# Patient Record
Sex: Male | Born: 1955
Health system: Southern US, Community
[De-identification: ages and names within clinical notes are randomized; demographics above are authoritative.]

## PROBLEM LIST (undated history)

## (undated) DIAGNOSIS — I10 Essential (primary) hypertension: Secondary | ICD-10-CM

## (undated) DIAGNOSIS — G518 Other disorders of facial nerve: Secondary | ICD-10-CM

## (undated) DIAGNOSIS — M1990 Unspecified osteoarthritis, unspecified site: Secondary | ICD-10-CM

## (undated) DIAGNOSIS — M545 Low back pain, unspecified: Secondary | ICD-10-CM

## (undated) DIAGNOSIS — K219 Gastro-esophageal reflux disease without esophagitis: Secondary | ICD-10-CM

## (undated) DIAGNOSIS — Z8719 Personal history of other diseases of the digestive system: Secondary | ICD-10-CM

## (undated) DIAGNOSIS — Z87442 Personal history of urinary calculi: Secondary | ICD-10-CM

## (undated) DIAGNOSIS — F411 Generalized anxiety disorder: Secondary | ICD-10-CM

## (undated) DIAGNOSIS — Z86010 Personal history of colonic polyps: Secondary | ICD-10-CM

## (undated) DIAGNOSIS — E7800 Pure hypercholesterolemia, unspecified: Secondary | ICD-10-CM

## (undated) DIAGNOSIS — Z860101 Personal history of adenomatous and serrated colon polyps: Secondary | ICD-10-CM

## (undated) DIAGNOSIS — N400 Enlarged prostate without lower urinary tract symptoms: Secondary | ICD-10-CM

## (undated) HISTORY — PX: LITHOTRIPSY: SUR834

## (undated) HISTORY — DX: Pure hypercholesterolemia, unspecified: E78.00

## (undated) HISTORY — DX: Generalized anxiety disorder: F41.1

## (undated) HISTORY — PX: VASECTOMY: SHX75

## (undated) HISTORY — DX: Personal history of other diseases of the digestive system: Z87.19

## (undated) HISTORY — DX: Other disorders of facial nerve: G51.8

## (undated) HISTORY — PX: NOSE SURGERY: SHX723

## (undated) HISTORY — DX: Low back pain, unspecified: M54.50

## (undated) HISTORY — DX: Essential (primary) hypertension: I10

## (undated) HISTORY — DX: Benign prostatic hyperplasia without lower urinary tract symptoms: N40.0

## (undated) HISTORY — DX: Unspecified osteoarthritis, unspecified site: M19.90

## (undated) HISTORY — DX: Personal history of colonic polyps: Z86.010

## (undated) HISTORY — DX: Personal history of urinary calculi: Z87.442

## (undated) HISTORY — DX: Personal history of adenomatous and serrated colon polyps: Z86.0101

## (undated) HISTORY — DX: Gastro-esophageal reflux disease without esophagitis: K21.9

## (undated) HISTORY — PX: FACIAL COSMETIC SURGERY: SHX629

---
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
## 2000-06-26 ENCOUNTER — Encounter: Payer: Self-pay | Admitting: Family Medicine

## 2000-06-26 ENCOUNTER — Encounter: Admission: RE | Admit: 2000-06-26 | Discharge: 2000-06-26 | Payer: Self-pay | Admitting: Family Medicine

## 2001-03-10 ENCOUNTER — Encounter: Admission: RE | Admit: 2001-03-10 | Discharge: 2001-03-10 | Payer: Self-pay | Admitting: *Deleted

## 2001-03-10 ENCOUNTER — Encounter: Payer: Self-pay | Admitting: Family Medicine

## 2003-03-26 ENCOUNTER — Encounter: Payer: Self-pay | Admitting: Emergency Medicine

## 2003-03-26 ENCOUNTER — Emergency Department (HOSPITAL_COMMUNITY): Admission: EM | Admit: 2003-03-26 | Discharge: 2003-03-26 | Payer: Self-pay | Admitting: Emergency Medicine

## 2007-07-20 ENCOUNTER — Emergency Department (HOSPITAL_COMMUNITY): Admission: EM | Admit: 2007-07-20 | Discharge: 2007-07-20 | Payer: Self-pay | Admitting: Emergency Medicine

## 2007-07-26 ENCOUNTER — Ambulatory Visit (HOSPITAL_COMMUNITY): Admission: RE | Admit: 2007-07-26 | Discharge: 2007-07-26 | Payer: Self-pay | Admitting: Urology

## 2007-08-16 ENCOUNTER — Ambulatory Visit (HOSPITAL_COMMUNITY): Admission: RE | Admit: 2007-08-16 | Discharge: 2007-08-16 | Payer: Self-pay | Admitting: Urology

## 2009-02-12 ENCOUNTER — Ambulatory Visit (HOSPITAL_COMMUNITY): Admission: RE | Admit: 2009-02-12 | Discharge: 2009-02-12 | Payer: Self-pay | Admitting: *Deleted

## 2009-02-12 ENCOUNTER — Encounter (INDEPENDENT_AMBULATORY_CARE_PROVIDER_SITE_OTHER): Payer: Self-pay | Admitting: *Deleted

## 2010-04-15 ENCOUNTER — Ambulatory Visit (HOSPITAL_COMMUNITY): Admission: RE | Admit: 2010-04-15 | Discharge: 2010-04-15 | Payer: Self-pay | Admitting: Urology

## 2011-01-21 NOTE — Op Note (Signed)
NAMETAYLER, [name]:  [id number]   MEDICAL RECORD NO.:  [id number]          PATIENT TYPE:  AMB   LOCATION:  ENDO                         FACILITY:  [facility]   PHYSICIAN:  Georgiana Spinner, M.D.    DATE OF BIRTH:  1955/10/03   DATE OF PROCEDURE:  02/12/2009  DATE OF DISCHARGE:                               OPERATIVE REPORT   PROCEDURE:  Colonoscopy.   INDICATIONS:  Colon cancer screening.   ANESTHESIA:  Fentanyl 100 mcg, Versed 10 mg and Benadryl 25 mg..   INDICATIONS:  Colon cancer screening.   PROCEDURE:  With the patient mildly sedated in the left lateral  decubitus position, a rectal exam was performed which was unremarkable.  Subsequently the Pentax videoscopic pediatric colonoscope was inserted  in the rectum and passed under direct vision to the cecum, identified by  ileocecal valve and appendiceal orifice, both of which were  photographed. From this point the colonoscope was slowly withdrawn  taking circumferential views of colonic mucosa, stopping in the  ascending colon where we saw two polyps and removed them both, first  with snare cautery technique, second with snare cautery technique,  followed by hot biopsy forceps technique.  They were both pedunculated,  and with the second I had to cauterize some remaining tissue on the  stalk with the hot biopsy forceps.  All the tissue was retrieved for  pathology and from this point, the colonoscope was slowly withdrawn,  taking circumferential views of colonic mucosa, stopping in the rectum  which appeared normal on direct and showed hemorrhoids on retroflexed  view, and pulled through the anal canal.  The endoscope was withdrawn.  The patient's vital signs, pulse oximeter remained stable.  The patient  tolerated procedure well without apparent complications.   FINDINGS:  Internal hemorrhoids, small polyps of ascending colon  removed.  Await biopsy report.  The patient will call me for results and  follow-up with me as an outpatient.           ______________________________  Georgiana Spinner, M.D.     GMO/MEDQ  D:  02/12/2009  T:  02/12/2009  Job:  [id number]

## 2011-01-21 NOTE — Op Note (Signed)
NAMESIMPSON, PAULOS            ACCOUNT NO.:  [id number]   MEDICAL RECORD NO.:  [id number]          PATIENT TYPE:  AMB   LOCATION:  DAY                          [facility]ACILITY:  [facility]   PHYSICIAN:  Valetta Fuller, M.D.  DATE OF BIRTH:  1955-10-18   DATE OF PROCEDURE:  08/16/2007  DATE OF DISCHARGE:                               OPERATIVE REPORT   PREOPERATIVE DIAGNOSES:  1. His right mid to proximal ureteral calculus.  2. Status post failed lithotripsy.   POSTOPERATIVE DIAGNOSES:  1. His right mid to proximal ureteral calculus.  2. Status post failed lithotripsy.   PROCEDURE PERFORMED:  Cystoscopy, right ureteroscopy, holmium laser  lithotripsy, basketing of the stone fragments, and double-J stent  placement.   SURGEON:  Valetta Fuller, M.D.   ANESTHESIA:  General.   INDICATIONS:  Mr. Ryan Webster is a 55 year old male.  He had presented  with right-sided abdominal discomfort and had been diagnosed with some  bilateral renal calculi and what appeared to be about a 5 to 6-mm stone  in his right mid-ureter.  The stone was well visualized on KUB, and he  elected to have lithotripsy.  That was performed approximately 3 weeks  ago.  At the time of surgery, we did not see a lot of fragmentation.  The patient has passed no pieces.  He came in for followup recently, and  I KUB'd the stone, and it appeared to be relatively unchanged in  appearance and location.  We discussed the alternative management  possibilities with him and have decided to proceed with ureteroscopy.  He appears to understand the complications as well the advantages of  this type of approach.  Full informed consent has been obtained.   TECHNIQUE AND FINDINGS:  The patient was brought to the operating room.  He had received perioperative ciprofloxacin.  He had successful  induction of general anesthesia and was placed in lithotomy position.  He was prepped and draped in the usual manner.   Fluoroscopy showed the  calcification again in the area of the right mid-  ureter.  There was no need really to confirm that with retrograde  pyelography.  Cystoscopy showed moderate trilobar hyperplasia with a  fairly high-riding median bar.  The bladder otherwise showed no  pathology.  A sensor guidewire was placed with fluoroscopic guidance  beyond the stone to the renal pelvis without difficulty.  We initially  engaged the distal ureter with the short rigid ureteroscope but were  unable to get up to the stone.  We subsequently used the long 6.5-French  rigid ureteroscope and were easily able to get to the stone.  There was  about a 5 to 6-mm stone near the junction of the proximal and mid-  ureter.  There appeared to be some moderately inflamed mucosa in that  area.  Holmium laser lithotriptor was used to break up the stone very  nicely into numerous pieces.  The largest 2 to 3-mm fragments were  basket extracted.  Because of the inflammation, we felt that double-J  stent placement should be performed.  There was no need to dilate the  distal ureter, but again, there was an area of inflammation within the  mid-ureter where the stone had been present for probably about a month.  Over the guidewire, we placed a 6-French 24-cm double-J stent  and left the dangle string which was secured to the patient's penis.  The stent was placed with fluoroscopic as well as visual guidance.  No  obvious complications occurred.  The patient appeared to tolerate things  well.  He was brought to the recovery room in stable condition.           ______________________________  Valetta Fuller, M.D.  Electronically Signed     DSG/MEDQ  D:  08/16/2007  T:  08/16/2007  Job:  [id number]

## 2011-06-16 LAB — BASIC METABOLIC PANEL
Calcium: 9.1
GFR calc Af Amer: >60
Potassium: 3.9
Sodium: 139

## 2011-06-17 LAB — URINALYSIS, ROUTINE W REFLEX MICROSCOPIC
Bilirubin Urine: NEGATIVE
Glucose, UA: NEGATIVE
Ketones, ur: 15 — AB
Leukocytes, UA: NEGATIVE
Nitrite: NEGATIVE
Protein, ur: NEGATIVE
Specific Gravity, Urine: 1.027
Urobilinogen, UA: 0.2
pH: 6

## 2011-06-17 LAB — CBC
HCT: 46.4
Hemoglobin: 16.2
MCHC: 34.9
MCV: 95.9
Platelets: 272
RBC: 4.84
RDW: 12.6
WBC: 8

## 2011-06-17 LAB — URINE MICROSCOPIC-ADD ON

## 2011-06-17 LAB — DIFFERENTIAL
Basophils Absolute: 0
Basophils Relative: 1
Eosinophils Absolute: 0 — ABNORMAL LOW
Eosinophils Relative: 1
Lymphocytes Relative: 17
Lymphs Abs: 1.4
Monocytes Absolute: 0.6
Monocytes Relative: 7
Neutro Abs: 5.9
Neutrophils Relative %: 74

## 2011-06-17 LAB — URINE CULTURE
Colony Count: NO GROWTH
Culture: NO GROWTH

## 2011-06-17 LAB — BASIC METABOLIC PANEL WITH GFR
CO2: 24
Calcium: 9.3
Chloride: 107
GFR calc non Af Amer: 57 — ABNORMAL LOW
Potassium: 3.7

## 2011-06-17 LAB — BASIC METABOLIC PANEL
BUN: 21
Creatinine, Ser: 1.32
GFR calc Af Amer: >60
Glucose, Bld: 147 — ABNORMAL HIGH
Sodium: 140

## 2012-07-24 IMAGING — CR DG ABDOMEN 1V
2 series · 2 of 2 positions shown · non-contrast
Comparison: 07/26/2007

CLINICAL DATA: 54-year-old male with the left UPJ stone

ABDOMEN - 1 VIEW

[t abdomen supine (1 of 2)]
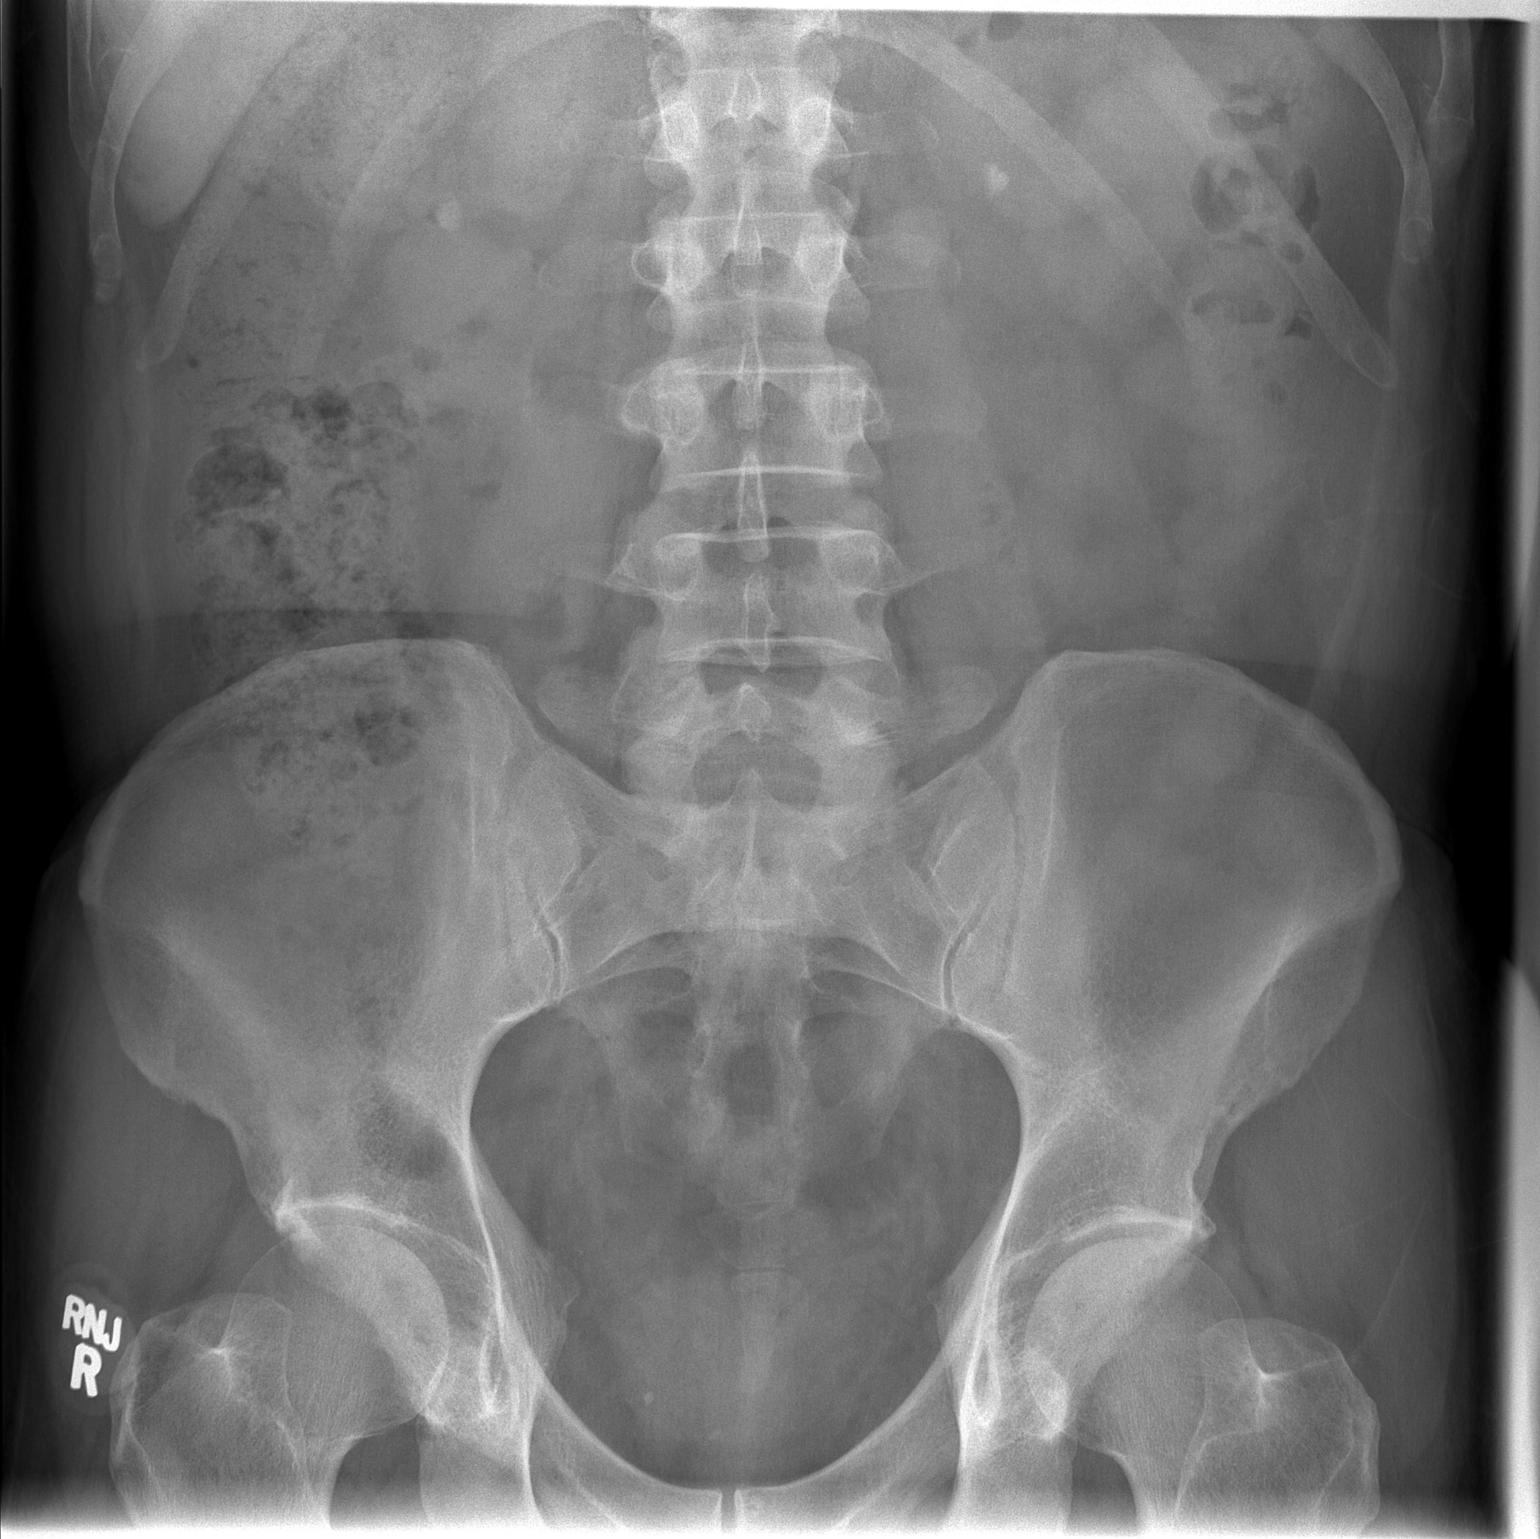

[t abdomen supine (2 of 2)]
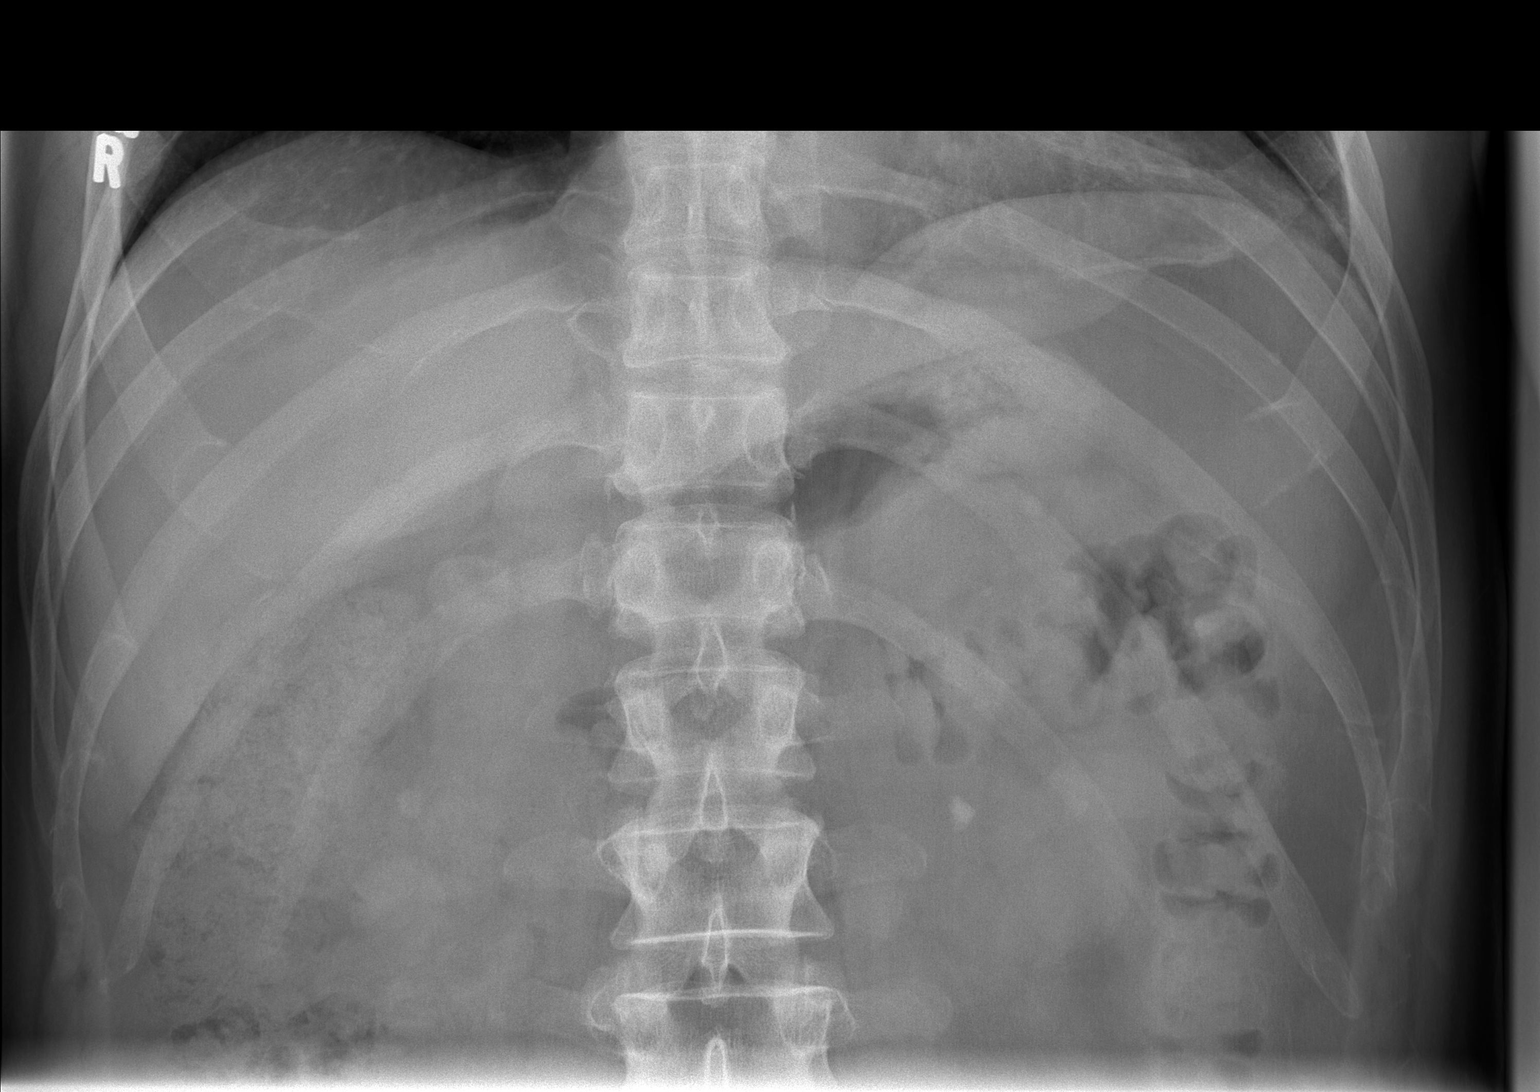

[2 of 2 positions shown; findings below may reference images not displayed]

FINDINGS: Bilateral renal calculi are seen;  6 mm in the right
kidney and 7 mm in the left kidney/left UPJ.  Phleboliths are again
seen in the pelvis.  There are no other unexpected calcifications.
Stool and gas seen throughout the colon.  The bones are
unremarkable.  The lung bases are clear.
IMPRESSION: Bilateral renal calculi detailed above.

## 2013-10-28 ENCOUNTER — Ambulatory Visit (INDEPENDENT_AMBULATORY_CARE_PROVIDER_SITE_OTHER): Payer: BC Managed Care – PPO | Admitting: Neurology

## 2013-10-28 ENCOUNTER — Encounter: Payer: Self-pay | Admitting: Neurology

## 2013-10-28 ENCOUNTER — Encounter (INDEPENDENT_AMBULATORY_CARE_PROVIDER_SITE_OTHER): Payer: Self-pay

## 2013-10-28 ENCOUNTER — Telehealth: Payer: Self-pay | Admitting: *Deleted

## 2013-10-28 VITALS — BP 120/80 | HR 62 | Ht 75.0 in | Wt 208.0 lb

## 2013-10-28 DIAGNOSIS — R253 Fasciculation: Secondary | ICD-10-CM

## 2013-10-28 DIAGNOSIS — G518 Other disorders of facial nerve: Secondary | ICD-10-CM

## 2013-10-28 DIAGNOSIS — G245 Blepharospasm: Secondary | ICD-10-CM

## 2013-10-28 MED ORDER — INCOBOTULINUMTOXINA 50 UNITS IM SOLR
50.0000 [IU] | Freq: Once | INTRAMUSCULAR | Status: AC
  Administered 2013-10-28: 50 [IU] via INTRAMUSCULAR
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
## 2014-01-25 ENCOUNTER — Ambulatory Visit: Payer: BC Managed Care – PPO | Admitting: Neurology

## 2014-03-01 ENCOUNTER — Ambulatory Visit (INDEPENDENT_AMBULATORY_CARE_PROVIDER_SITE_OTHER): Payer: BC Managed Care – PPO | Admitting: Neurology

## 2014-03-01 ENCOUNTER — Encounter: Payer: Self-pay | Admitting: Neurology

## 2014-03-01 DIAGNOSIS — G518 Other disorders of facial nerve: Secondary | ICD-10-CM

## 2014-03-01 DIAGNOSIS — R253 Fasciculation: Secondary | ICD-10-CM

## 2014-03-01 DIAGNOSIS — G245 Blepharospasm: Secondary | ICD-10-CM

## 2014-03-01 MED ORDER — INCOBOTULINUMTOXINA 50 UNITS IM SOLR
50.0000 [IU] | Freq: Once | INTRAMUSCULAR | Status: AC
  Administered 2014-03-01: 50 [IU] via INTRAMUSCULAR
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
## 2014-06-07 ENCOUNTER — Ambulatory Visit (INDEPENDENT_AMBULATORY_CARE_PROVIDER_SITE_OTHER): Payer: BC Managed Care – PPO | Admitting: Neurology

## 2014-06-07 ENCOUNTER — Encounter (INDEPENDENT_AMBULATORY_CARE_PROVIDER_SITE_OTHER): Payer: Self-pay

## 2014-06-07 DIAGNOSIS — G518 Other disorders of facial nerve: Secondary | ICD-10-CM

## 2014-06-07 DIAGNOSIS — G245 Blepharospasm: Secondary | ICD-10-CM

## 2014-06-07 DIAGNOSIS — R253 Fasciculation: Secondary | ICD-10-CM

## 2014-06-07 MED ORDER — INCOBOTULINUMTOXINA 50 UNITS IM SOLR
50.0000 [IU] | Freq: Once | INTRAMUSCULAR | Status: AC
  Administered 2014-06-07: 50 [IU] via INTRAMUSCULAR
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
## 2014-09-06 ENCOUNTER — Ambulatory Visit (INDEPENDENT_AMBULATORY_CARE_PROVIDER_SITE_OTHER): Payer: BC Managed Care – PPO | Admitting: Neurology

## 2014-09-06 ENCOUNTER — Encounter: Payer: Self-pay | Admitting: Neurology

## 2014-09-06 DIAGNOSIS — R253 Fasciculation: Secondary | ICD-10-CM

## 2014-09-06 DIAGNOSIS — G518 Other disorders of facial nerve: Secondary | ICD-10-CM

## 2014-09-06 DIAGNOSIS — R258 Other abnormal involuntary movements: Secondary | ICD-10-CM

## 2014-09-06 MED ORDER — INCOBOTULINUMTOXINA 50 UNITS IM SOLR
50.0000 [IU] | Freq: Once | INTRAMUSCULAR | Status: AC
  Administered 2014-09-06: 50 [IU] via INTRAMUSCULAR
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
## 2014-12-13 ENCOUNTER — Ambulatory Visit: Payer: Self-pay | Admitting: Neurology

## 2015-01-01 ENCOUNTER — Other Ambulatory Visit: Payer: Self-pay | Admitting: Gastroenterology

## 2015-01-30 ENCOUNTER — Encounter: Payer: Self-pay | Admitting: *Deleted

## 2015-01-30 DIAGNOSIS — G245 Blepharospasm: Secondary | ICD-10-CM

## 2015-01-30 DIAGNOSIS — G5139 Clonic hemifacial spasm, unspecified: Secondary | ICD-10-CM

## 2015-01-31 ENCOUNTER — Ambulatory Visit (INDEPENDENT_AMBULATORY_CARE_PROVIDER_SITE_OTHER): Payer: BLUE CROSS/BLUE SHIELD | Admitting: Neurology

## 2015-01-31 ENCOUNTER — Encounter: Payer: Self-pay | Admitting: Neurology

## 2015-01-31 VITALS — BP 142/91 | HR 63 | Ht 75.0 in | Wt 203.0 lb

## 2015-01-31 DIAGNOSIS — G245 Blepharospasm: Secondary | ICD-10-CM | POA: Diagnosis not present

## 2015-01-31 MED ORDER — INCOBOTULINUMTOXINA 50 UNITS IM SOLR
50.0000 [IU] | Freq: Once | INTRAMUSCULAR | Status: AC
  Administered 2015-01-31: 50 [IU] via INTRAMUSCULAR
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
## 2015-01-31 NOTE — Progress Notes (Signed)
**  Xeomin 50, Lot [id number], Exp 10/2016**mck,rn

## 2016-09-15 DIAGNOSIS — R9431 Abnormal electrocardiogram [ECG] [EKG]: Secondary | ICD-10-CM | POA: Diagnosis not present

## 2016-09-15 DIAGNOSIS — I10 Essential (primary) hypertension: Secondary | ICD-10-CM | POA: Diagnosis not present

## 2017-01-08 DIAGNOSIS — E7800 Pure hypercholesterolemia, unspecified: Secondary | ICD-10-CM | POA: Diagnosis not present

## 2017-01-14 DIAGNOSIS — E7800 Pure hypercholesterolemia, unspecified: Secondary | ICD-10-CM | POA: Diagnosis not present

## 2017-01-14 DIAGNOSIS — I10 Essential (primary) hypertension: Secondary | ICD-10-CM | POA: Diagnosis not present

## 2017-04-13 DIAGNOSIS — L732 Hidradenitis suppurativa: Secondary | ICD-10-CM | POA: Diagnosis not present

## 2017-04-13 DIAGNOSIS — L0292 Furuncle, unspecified: Secondary | ICD-10-CM | POA: Diagnosis not present

## 2017-04-30 DIAGNOSIS — M545 Low back pain: Secondary | ICD-10-CM | POA: Diagnosis not present

## 2017-04-30 DIAGNOSIS — L739 Follicular disorder, unspecified: Secondary | ICD-10-CM | POA: Diagnosis not present

## 2017-05-06 DIAGNOSIS — M5489 Other dorsalgia: Secondary | ICD-10-CM | POA: Diagnosis not present

## 2017-06-22 NOTE — Telephone Encounter (Signed)
Close Encounter 

## 2017-07-16 DIAGNOSIS — Z125 Encounter for screening for malignant neoplasm of prostate: Secondary | ICD-10-CM | POA: Diagnosis not present

## 2017-07-16 DIAGNOSIS — Z0000 Encounter for general adult medical examination without abnormal findings: Secondary | ICD-10-CM | POA: Diagnosis not present

## 2017-07-22 DIAGNOSIS — Z0000 Encounter for general adult medical examination without abnormal findings: Secondary | ICD-10-CM | POA: Diagnosis not present

## 2017-07-22 DIAGNOSIS — Z23 Encounter for immunization: Secondary | ICD-10-CM | POA: Diagnosis not present

## 2017-07-22 DIAGNOSIS — N200 Calculus of kidney: Secondary | ICD-10-CM | POA: Diagnosis not present

## 2017-11-19 ENCOUNTER — Ambulatory Visit: Payer: BLUE CROSS/BLUE SHIELD | Admitting: Neurology

## 2017-11-30 DIAGNOSIS — M9904 Segmental and somatic dysfunction of sacral region: Secondary | ICD-10-CM | POA: Diagnosis not present

## 2017-11-30 DIAGNOSIS — M9903 Segmental and somatic dysfunction of lumbar region: Secondary | ICD-10-CM | POA: Diagnosis not present

## 2017-11-30 DIAGNOSIS — M9905 Segmental and somatic dysfunction of pelvic region: Secondary | ICD-10-CM | POA: Diagnosis not present

## 2017-12-07 DIAGNOSIS — M9905 Segmental and somatic dysfunction of pelvic region: Secondary | ICD-10-CM | POA: Diagnosis not present

## 2017-12-07 DIAGNOSIS — M9903 Segmental and somatic dysfunction of lumbar region: Secondary | ICD-10-CM | POA: Diagnosis not present

## 2017-12-07 DIAGNOSIS — M9904 Segmental and somatic dysfunction of sacral region: Secondary | ICD-10-CM | POA: Diagnosis not present

## 2017-12-31 DIAGNOSIS — M9903 Segmental and somatic dysfunction of lumbar region: Secondary | ICD-10-CM | POA: Diagnosis not present

## 2017-12-31 DIAGNOSIS — M9904 Segmental and somatic dysfunction of sacral region: Secondary | ICD-10-CM | POA: Diagnosis not present

## 2017-12-31 DIAGNOSIS — M9905 Segmental and somatic dysfunction of pelvic region: Secondary | ICD-10-CM | POA: Diagnosis not present

## 2018-04-07 DIAGNOSIS — R6889 Other general symptoms and signs: Secondary | ICD-10-CM | POA: Diagnosis not present

## 2018-04-30 DIAGNOSIS — K5901 Slow transit constipation: Secondary | ICD-10-CM | POA: Diagnosis not present

## 2018-04-30 DIAGNOSIS — K625 Hemorrhage of anus and rectum: Secondary | ICD-10-CM | POA: Diagnosis not present

## 2018-05-03 DIAGNOSIS — N200 Calculus of kidney: Secondary | ICD-10-CM | POA: Diagnosis not present

## 2018-06-16 DIAGNOSIS — K921 Melena: Secondary | ICD-10-CM | POA: Diagnosis not present

## 2018-07-29 DIAGNOSIS — Z0000 Encounter for general adult medical examination without abnormal findings: Secondary | ICD-10-CM | POA: Diagnosis not present

## 2018-07-29 DIAGNOSIS — Z125 Encounter for screening for malignant neoplasm of prostate: Secondary | ICD-10-CM | POA: Diagnosis not present

## 2018-08-02 DIAGNOSIS — Z0000 Encounter for general adult medical examination without abnormal findings: Secondary | ICD-10-CM | POA: Diagnosis not present

## 2018-08-02 DIAGNOSIS — N200 Calculus of kidney: Secondary | ICD-10-CM | POA: Diagnosis not present

## 2018-08-02 DIAGNOSIS — I10 Essential (primary) hypertension: Secondary | ICD-10-CM | POA: Diagnosis not present

## 2018-08-02 DIAGNOSIS — Z1212 Encounter for screening for malignant neoplasm of rectum: Secondary | ICD-10-CM | POA: Diagnosis not present

## 2018-08-23 DIAGNOSIS — D123 Benign neoplasm of transverse colon: Secondary | ICD-10-CM | POA: Diagnosis not present

## 2018-08-23 DIAGNOSIS — Z86010 Personal history of colonic polyps: Secondary | ICD-10-CM | POA: Diagnosis not present

## 2018-08-23 DIAGNOSIS — K621 Rectal polyp: Secondary | ICD-10-CM | POA: Diagnosis not present

## 2018-08-23 DIAGNOSIS — D124 Benign neoplasm of descending colon: Secondary | ICD-10-CM | POA: Diagnosis not present

## 2019-01-17 DIAGNOSIS — W57XXXA Bitten or stung by nonvenomous insect and other nonvenomous arthropods, initial encounter: Secondary | ICD-10-CM | POA: Diagnosis not present

## 2019-01-17 DIAGNOSIS — S20469A Insect bite (nonvenomous) of unspecified back wall of thorax, initial encounter: Secondary | ICD-10-CM | POA: Diagnosis not present

## 2019-12-14 ENCOUNTER — Ambulatory Visit: Payer: 59 | Admitting: Dermatology

## 2019-12-14 ENCOUNTER — Other Ambulatory Visit: Payer: Self-pay

## 2019-12-14 ENCOUNTER — Encounter: Payer: Self-pay | Admitting: Dermatology

## 2019-12-14 DIAGNOSIS — D485 Neoplasm of uncertain behavior of skin: Secondary | ICD-10-CM | POA: Diagnosis not present

## 2019-12-14 DIAGNOSIS — I781 Nevus, non-neoplastic: Secondary | ICD-10-CM | POA: Diagnosis not present

## 2019-12-14 DIAGNOSIS — D492 Neoplasm of unspecified behavior of bone, soft tissue, and skin: Secondary | ICD-10-CM

## 2019-12-14 NOTE — Patient Instructions (Addendum)
Biopsy, Surgery (Curettage) & Surgery (Excision) Aftercare Instructions  1. Okay to remove bandage in 24 hours  2. Wash area with soap and water  3. Apply Vaseline to area twice daily until healed (Not Neosporin)  4. Okay to cover with a Band-Aid to decrease the chance of infection or prevent irritation from clothing; also it's okay to uncover lesion at home.  5. Suture instructions: return to our office in 7-10 or 10-14 days for a nurse visit for suture removal. Variable healing with sutures, if pain or itching occurs call our office. It's okay to shower or bathe 24 hours after sutures are given.  6. The following risks may occur after a biopsy, curettage or excision: bleeding, scarring, discoloration, recurrence, infection (redness, yellow drainage, pain or swelling).  7. For questions, concerns and results call our office at [place] before 4pm & Friday before 3pm. Biopsy results will be available in 1 week.  In addition to obtaining a biopsy on Mr. Robel right arm we discussed treatment options for the reticular and spider veins on his legs.  I recommended he consult Dr. Cresenciano Lick and specifically to ask whether he does sclerotherapy with Asclera or polidocanol.

## 2019-12-15 ENCOUNTER — Encounter: Payer: Self-pay | Admitting: Dermatology

## 2019-12-15 NOTE — Progress Notes (Addendum)
   New Patient   Subjective  Ryan Webster is a 64 y.o. male who presents for the following: New Patient (Initial Visit) (Patient here today for bump on right arm x few years comes back during the warm months.  Patient denies bleeding unless he picks at it, no pain. Patient also has a question about spider veins.).  Growth Location: Right forearm Duration: Months Quality: Larger Associated Signs/Symptoms: Modifying Factors:  Severity:  Timing: Context:    The following portions of the chart were reviewed this encounter and updated as appropriate: Tobacco  Allergies  Meds  Problems  Med Hx  Surg Hx  Fam Hx      Objective  Well appearing patient in no apparent distress; mood and affect are within normal limits.  All skin waist up examined.Plus legs. No atypcial moles  Assessment & Plan  Neoplasm of skin Right Dorsal Forearm  Skin / nail biopsy Type of biopsy: tangential   Informed consent: discussed and consent obtained   Anesthesia: the lesion was anesthetized in a standard fashion   Anesthetic:  1% lidocaine w/ epinephrine 1-100,000 local infiltration Instrument used: flexible razor blade   Hemostasis achieved with: ferric subsulfate   Outcome: patient tolerated procedure well   Post-procedure details: sterile dressing applied and wound care instructions given   Dressing type: petrolatum   Additional details:  Patient identified lesion of concern.  Lesion identified by physician.  Specimen 1 - Surgical pathology Differential Diagnosis: R/O BCC vs SCC Check Margins: No Cautery  Spider veins (3) Left Thigh - Anterior; Right Thigh - Anterior (2)  This would do better with polidocanol than saline sclerotherapy. Will refer to Dr. Cresenciano Lick.

## 2020-02-29 ENCOUNTER — Telehealth: Payer: Self-pay

## 2020-02-29 NOTE — Telephone Encounter (Signed)
Spoke with patient in regards to being seen again. He's going to reach out to his PCP and get a referral.

## 2020-02-29 NOTE — Telephone Encounter (Signed)
Pt called office and left a VM asking for a call to discuss scheduling another appt with Dr. Krista Blue.

## 2020-04-10 ENCOUNTER — Ambulatory Visit: Payer: 59 | Admitting: Neurology

## 2020-04-10 ENCOUNTER — Telehealth: Payer: Self-pay | Admitting: Neurology

## 2020-04-10 ENCOUNTER — Encounter: Payer: Self-pay | Admitting: *Deleted

## 2020-04-10 ENCOUNTER — Encounter: Payer: Self-pay | Admitting: Neurology

## 2020-04-10 VITALS — BP 142/92 | HR 72 | Ht 75.25 in | Wt 202.0 lb

## 2020-04-10 DIAGNOSIS — R253 Fasciculation: Secondary | ICD-10-CM | POA: Diagnosis not present

## 2020-04-10 DIAGNOSIS — G518 Other disorders of facial nerve: Secondary | ICD-10-CM | POA: Insufficient documentation

## 2020-04-10 DIAGNOSIS — G244 Idiopathic orofacial dystonia: Secondary | ICD-10-CM | POA: Diagnosis not present
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
## 2020-04-10 NOTE — Telephone Encounter (Signed)
EMG guided xeomin 50 units for G24.4, G51.8

## 2020-04-18 NOTE — Telephone Encounter (Signed)
In process 

## 2020-04-19 NOTE — Telephone Encounter (Signed)
Noted  

## 2020-04-19 NOTE — Telephone Encounter (Signed)
I called [employer] and spoke with Summer. She states codes [phone], [phone], [phone] are valid and billable and do not require PA. She states [facility] is the pharmacy patient will need to use. Reference #[id number].   Could you please send patient's Xeomin prescription to [facility]? He will need 50 units.

## 2020-04-19 NOTE — Telephone Encounter (Signed)
Patient returned my call and we scheduled first injection for next available of 9/22.

## 2020-04-19 NOTE — Telephone Encounter (Signed)
I called the patient and LVM to schedule first Xeomin injection.

## 2020-05-21 NOTE — Telephone Encounter (Signed)
I called [facility] and spoke with Genia Del to check the status of Xeomin order. Genia Del states that they did not receive a prescription for patient that was supposed to be sent in early August. He forwarded me to [facility], a pharmacist, to give a verbal prescription. Order will need to go through medical benefit review and then we will be called to schedule delivery.

## 2020-05-23 NOTE — Telephone Encounter (Signed)
I called [facility] to check the status and spoke with Genia Del again. He states that it is still under medical benefit review and I should call back in 24 hours.

## 2020-05-29 NOTE — Telephone Encounter (Signed)
I called [facility] again today to check the status. I spoke with Opal Sidles, who states that medication is ready to be shipped. However, patient has not given consent for shipment. I called the patient and was able to initiate a conference call for the 3 of [place]. Opal Sidles states that patient has a $200 co-pay for the Xeomin. Patient states that he is not able to afford that at this time. He wishes to cancel his 9/22 appointment and will call back at a later date. FYI

## 2020-05-29 NOTE — Telephone Encounter (Signed)
Try buy and bill, 50 units of xeomin on this patient,

## 2020-05-29 NOTE — Telephone Encounter (Signed)
I called the patient and explained to him the difference between SP and B/B. I advised that if he has a co-pay when he visits our office, he will still have to pay it, but will not have to pay anything else up front. Also advised that he may still receive a bill in the mail after his injection. He agreed and was placed back on the schedule for tomorrow at 2:00.

## 2020-05-30 ENCOUNTER — Encounter: Payer: Self-pay | Admitting: Neurology

## 2020-05-30 ENCOUNTER — Ambulatory Visit: Payer: 59 | Admitting: Neurology

## 2020-05-30 ENCOUNTER — Other Ambulatory Visit: Payer: Self-pay

## 2020-05-30 VITALS — BP 154/86 | HR 68 | Ht 75.25 in | Wt 204.0 lb

## 2020-05-30 DIAGNOSIS — G518 Other disorders of facial nerve: Secondary | ICD-10-CM | POA: Diagnosis not present

## 2020-05-30 DIAGNOSIS — R253 Fasciculation: Secondary | ICD-10-CM

## 2020-05-30 MED ORDER — INCOBOTULINUMTOXINA 50 UNITS IM SOLR
50.0000 [IU] | INTRAMUSCULAR | Status: AC
  Administered 2020-05-30: 50 [IU] via INTRAMUSCULAR
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
## 2020-05-30 NOTE — Progress Notes (Signed)
**  Xeomin 50 units x 1 vial, NDC 0259-1605-01, Lot [id number], Exp 12/2021, office supply.//mck,rn** 

## 2020-07-11 NOTE — Telephone Encounter (Signed)
FYI Angie 

## 2020-09-19 ENCOUNTER — Ambulatory Visit: Payer: 59 | Admitting: Neurology

## 2020-09-20 ENCOUNTER — Telehealth: Payer: Self-pay | Admitting: Neurology

## 2020-09-20 NOTE — Telephone Encounter (Signed)
I have been communicating with patient regarding the cost of Xeomin. He states he can't afford to pay what he has been charged in the past for injections. I spoke with patient and advised him of the [facility] and he is willing to try it. I filled out an enrollment form for patient and faxed it to the program. I sent patient a packet in the mail with instructions on how to participate in the program and submit claims after injections.

## 2020-10-04 ENCOUNTER — Other Ambulatory Visit: Payer: Self-pay | Admitting: Internal Medicine

## 2020-10-04 DIAGNOSIS — I10 Essential (primary) hypertension: Secondary | ICD-10-CM

## 2020-10-19 ENCOUNTER — Ambulatory Visit
Admission: RE | Admit: 2020-10-19 | Discharge: 2020-10-19 | Disposition: A | Discharge status: Home / Self Care | Payer: No Typology Code available for payment source | Source: Ambulatory Visit | Attending: Internal Medicine | Admitting: Internal Medicine

## 2020-10-19 DIAGNOSIS — I10 Essential (primary) hypertension: Secondary | ICD-10-CM

## 2020-12-19 ENCOUNTER — Ambulatory Visit: Payer: 59 | Admitting: Neurology

## 2021-08-28 NOTE — Telephone Encounter (Signed)
Spoke with patient today. Patient is scheduled for Xeomin injection 10/07/21 with Dr. Krista Blue.

## 2021-09-25 ENCOUNTER — Telehealth: Payer: Self-pay | Admitting: Neurology

## 2021-09-25 NOTE — Telephone Encounter (Signed)
Patient is scheduled for Xeomin injection 10/07/21. Obtained PA for Xeomin via [facility] portal. [place] #[id number] (09/24/21-09/24/22). Fasciculation- R25.3 (50 units).

## 2021-10-04 DIAGNOSIS — R7301 Impaired fasting glucose: Secondary | ICD-10-CM | POA: Diagnosis not present

## 2021-10-04 DIAGNOSIS — I10 Essential (primary) hypertension: Secondary | ICD-10-CM | POA: Diagnosis not present

## 2021-10-04 DIAGNOSIS — E7800 Pure hypercholesterolemia, unspecified: Secondary | ICD-10-CM | POA: Diagnosis not present

## 2021-10-04 DIAGNOSIS — Z0000 Encounter for general adult medical examination without abnormal findings: Secondary | ICD-10-CM | POA: Diagnosis not present

## 2021-10-07 ENCOUNTER — Ambulatory Visit: Payer: Medicare Other | Admitting: Neurology

## 2021-10-07 ENCOUNTER — Encounter: Payer: Self-pay | Admitting: Neurology

## 2021-10-07 VITALS — BP 170/90 | HR 99 | Ht 75.0 in | Wt 204.0 lb

## 2021-10-07 DIAGNOSIS — G244 Idiopathic orofacial dystonia: Secondary | ICD-10-CM
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
## 2021-10-07 NOTE — Progress Notes (Signed)
Xeomin 50 units  COB-7949-9718-20 [id number] Exp-04/2022  B/B

## 2021-10-08 DIAGNOSIS — L409 Psoriasis, unspecified: Secondary | ICD-10-CM | POA: Diagnosis not present

## 2021-10-08 DIAGNOSIS — Z23 Encounter for immunization: Secondary | ICD-10-CM | POA: Diagnosis not present

## 2021-10-08 DIAGNOSIS — N200 Calculus of kidney: Secondary | ICD-10-CM | POA: Diagnosis not present

## 2021-10-08 DIAGNOSIS — I700 Atherosclerosis of aorta: Secondary | ICD-10-CM | POA: Diagnosis not present

## 2021-10-08 DIAGNOSIS — R7303 Prediabetes: Secondary | ICD-10-CM | POA: Diagnosis not present

## 2021-10-08 DIAGNOSIS — I2510 Atherosclerotic heart disease of native coronary artery without angina pectoris: Secondary | ICD-10-CM | POA: Diagnosis not present

## 2021-10-08 DIAGNOSIS — Z0000 Encounter for general adult medical examination without abnormal findings: Secondary | ICD-10-CM | POA: Diagnosis not present

## 2021-10-08 DIAGNOSIS — I10 Essential (primary) hypertension: Secondary | ICD-10-CM | POA: Diagnosis not present

## 2021-10-08 DIAGNOSIS — Z1212 Encounter for screening for malignant neoplasm of rectum: Secondary | ICD-10-CM | POA: Diagnosis not present

## 2021-10-08 DIAGNOSIS — E7800 Pure hypercholesterolemia, unspecified: Secondary | ICD-10-CM | POA: Diagnosis not present

## 2021-10-11 DIAGNOSIS — G244 Idiopathic orofacial dystonia: Secondary | ICD-10-CM | POA: Diagnosis not present

## 2021-10-11 MED ORDER — INCOBOTULINUMTOXINA 50 UNITS IM SOLR
50.0000 [IU] | INTRAMUSCULAR | Status: AC
  Administered 2021-10-11: 50 [IU] via INTRAMUSCULAR

## 2021-11-25 DIAGNOSIS — E7800 Pure hypercholesterolemia, unspecified: Secondary | ICD-10-CM | POA: Diagnosis not present

## 2021-11-25 DIAGNOSIS — I2510 Atherosclerotic heart disease of native coronary artery without angina pectoris: Secondary | ICD-10-CM | POA: Diagnosis not present

## 2021-12-09 ENCOUNTER — Ambulatory Visit: Payer: Medicare Other | Admitting: Neurology

## 2021-12-30 ENCOUNTER — Encounter: Payer: Self-pay | Admitting: Neurology

## 2022-01-06 ENCOUNTER — Ambulatory Visit: Payer: Medicare Other | Admitting: Neurology

## 2022-02-24 ENCOUNTER — Ambulatory Visit: Payer: Medicare Other | Admitting: Neurology

## 2022-02-24 ENCOUNTER — Encounter: Payer: Self-pay | Admitting: Neurology

## 2022-02-24 VITALS — BP 166/94 | HR 94 | Ht 75.0 in | Wt 204.0 lb

## 2022-02-24 DIAGNOSIS — G244 Idiopathic orofacial dystonia: Secondary | ICD-10-CM

## 2022-02-24 NOTE — Progress Notes (Unsigned)
Xeomin '50mg'$  x 1 vial Ndc-0259-1605-01 [id number] Exp-09-2022 B/B
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
## 2022-02-25 DIAGNOSIS — G244 Idiopathic orofacial dystonia: Secondary | ICD-10-CM | POA: Diagnosis not present

## 2022-02-25 MED ORDER — INCOBOTULINUMTOXINA 50 UNITS IM SOLR
50.0000 [IU] | INTRAMUSCULAR | Status: AC
  Administered 2022-02-25: 50 [IU] via INTRAMUSCULAR

## 2022-05-27 ENCOUNTER — Ambulatory Visit: Payer: Medicare Other | Admitting: Neurology

## 2022-06-18 ENCOUNTER — Ambulatory Visit: Payer: Medicare Other | Admitting: Neurology

## 2022-11-03 DIAGNOSIS — R7303 Prediabetes: Secondary | ICD-10-CM | POA: Diagnosis not present

## 2022-11-03 DIAGNOSIS — E7800 Pure hypercholesterolemia, unspecified: Secondary | ICD-10-CM | POA: Diagnosis not present

## 2022-11-10 DIAGNOSIS — R7303 Prediabetes: Secondary | ICD-10-CM | POA: Diagnosis not present

## 2022-11-10 DIAGNOSIS — Z86010 Personal history of colonic polyps: Secondary | ICD-10-CM | POA: Diagnosis not present

## 2022-11-10 DIAGNOSIS — Z0000 Encounter for general adult medical examination without abnormal findings: Secondary | ICD-10-CM | POA: Diagnosis not present

## 2022-11-10 DIAGNOSIS — I8390 Asymptomatic varicose veins of unspecified lower extremity: Secondary | ICD-10-CM | POA: Diagnosis not present

## 2022-11-10 DIAGNOSIS — I10 Essential (primary) hypertension: Secondary | ICD-10-CM | POA: Diagnosis not present

## 2022-11-10 DIAGNOSIS — N200 Calculus of kidney: Secondary | ICD-10-CM | POA: Diagnosis not present

## 2022-11-10 DIAGNOSIS — L409 Psoriasis, unspecified: Secondary | ICD-10-CM | POA: Diagnosis not present

## 2022-11-10 DIAGNOSIS — I2510 Atherosclerotic heart disease of native coronary artery without angina pectoris: Secondary | ICD-10-CM | POA: Diagnosis not present

## 2022-11-10 DIAGNOSIS — F959 Tic disorder, unspecified: Secondary | ICD-10-CM | POA: Diagnosis not present

## 2022-11-10 DIAGNOSIS — I700 Atherosclerosis of aorta: Secondary | ICD-10-CM | POA: Diagnosis not present

## 2023-05-26 DIAGNOSIS — M7130 Other bursal cyst, unspecified site: Secondary | ICD-10-CM | POA: Diagnosis not present

## 2023-05-26 DIAGNOSIS — I788 Other diseases of capillaries: Secondary | ICD-10-CM | POA: Diagnosis not present

## 2023-05-26 DIAGNOSIS — L570 Actinic keratosis: Secondary | ICD-10-CM | POA: Diagnosis not present

## 2023-06-10 DIAGNOSIS — M79661 Pain in right lower leg: Secondary | ICD-10-CM | POA: Diagnosis not present

## 2023-06-10 DIAGNOSIS — M79604 Pain in right leg: Secondary | ICD-10-CM | POA: Diagnosis not present

## 2023-06-10 DIAGNOSIS — R600 Localized edema: Secondary | ICD-10-CM | POA: Diagnosis not present

## 2023-06-10 DIAGNOSIS — I83811 Varicose veins of right lower extremities with pain: Secondary | ICD-10-CM | POA: Diagnosis not present

## 2023-06-10 DIAGNOSIS — I83891 Varicose veins of right lower extremities with other complications: Secondary | ICD-10-CM | POA: Diagnosis not present

## 2023-07-13 DIAGNOSIS — I83891 Varicose veins of right lower extremities with other complications: Secondary | ICD-10-CM | POA: Diagnosis not present

## 2023-07-20 DIAGNOSIS — I83811 Varicose veins of right lower extremities with pain: Secondary | ICD-10-CM | POA: Diagnosis not present

## 2023-07-20 DIAGNOSIS — I83891 Varicose veins of right lower extremities with other complications: Secondary | ICD-10-CM | POA: Diagnosis not present

## 2023-07-20 DIAGNOSIS — M7989 Other specified soft tissue disorders: Secondary | ICD-10-CM | POA: Diagnosis not present

## 2023-08-03 DIAGNOSIS — I83891 Varicose veins of right lower extremities with other complications: Secondary | ICD-10-CM | POA: Diagnosis not present

## 2023-08-18 DIAGNOSIS — M67442 Ganglion, left hand: Secondary | ICD-10-CM | POA: Diagnosis not present

## 2023-09-23 ENCOUNTER — Ambulatory Visit (HOSPITAL_BASED_OUTPATIENT_CLINIC_OR_DEPARTMENT_OTHER): Admit: 2023-09-23 | Payer: 59 | Admitting: Orthopedic Surgery

## 2023-09-23 ENCOUNTER — Encounter (HOSPITAL_BASED_OUTPATIENT_CLINIC_OR_DEPARTMENT_OTHER): Payer: Self-pay

## 2023-09-23 SURGERY — CYST REMOVAL
Anesthesia: Monitor Anesthesia Care | Laterality: Left

## 2023-10-26 DIAGNOSIS — G2581 Restless legs syndrome: Secondary | ICD-10-CM | POA: Diagnosis not present

## 2023-10-26 DIAGNOSIS — I872 Venous insufficiency (chronic) (peripheral): Secondary | ICD-10-CM | POA: Diagnosis not present

## 2023-10-26 DIAGNOSIS — I87393 Chronic venous hypertension (idiopathic) with other complications of bilateral lower extremity: Secondary | ICD-10-CM | POA: Diagnosis not present

## 2023-10-26 DIAGNOSIS — R600 Localized edema: Secondary | ICD-10-CM | POA: Diagnosis not present

## 2023-11-11 ENCOUNTER — Ambulatory Visit (HOSPITAL_BASED_OUTPATIENT_CLINIC_OR_DEPARTMENT_OTHER): Admission: RE | Admit: 2023-11-11 | Payer: Medicare Other | Source: Home / Self Care | Admitting: Orthopedic Surgery

## 2023-11-11 ENCOUNTER — Encounter (HOSPITAL_BASED_OUTPATIENT_CLINIC_OR_DEPARTMENT_OTHER): Admission: RE | Payer: Self-pay | Source: Home / Self Care

## 2023-11-11 SURGERY — CYST REMOVAL
Anesthesia: Monitor Anesthesia Care | Laterality: Left

## 2023-12-07 DIAGNOSIS — J4521 Mild intermittent asthma with (acute) exacerbation: Secondary | ICD-10-CM | POA: Diagnosis not present

## 2023-12-07 DIAGNOSIS — I10 Essential (primary) hypertension: Secondary | ICD-10-CM | POA: Diagnosis not present

## 2023-12-21 DIAGNOSIS — I10 Essential (primary) hypertension: Secondary | ICD-10-CM | POA: Diagnosis not present

## 2023-12-21 DIAGNOSIS — J4521 Mild intermittent asthma with (acute) exacerbation: Secondary | ICD-10-CM | POA: Diagnosis not present

## 2024-01-18 DIAGNOSIS — I10 Essential (primary) hypertension: Secondary | ICD-10-CM | POA: Diagnosis not present

## 2024-01-18 DIAGNOSIS — E7800 Pure hypercholesterolemia, unspecified: Secondary | ICD-10-CM | POA: Diagnosis not present

## 2024-01-18 DIAGNOSIS — R7303 Prediabetes: Secondary | ICD-10-CM | POA: Diagnosis not present

## 2024-01-25 DIAGNOSIS — R7303 Prediabetes: Secondary | ICD-10-CM | POA: Diagnosis not present

## 2024-01-25 DIAGNOSIS — F959 Tic disorder, unspecified: Secondary | ICD-10-CM | POA: Diagnosis not present

## 2024-01-25 DIAGNOSIS — I700 Atherosclerosis of aorta: Secondary | ICD-10-CM | POA: Diagnosis not present

## 2024-01-25 DIAGNOSIS — Z0000 Encounter for general adult medical examination without abnormal findings: Secondary | ICD-10-CM | POA: Diagnosis not present

## 2024-01-25 DIAGNOSIS — I2510 Atherosclerotic heart disease of native coronary artery without angina pectoris: Secondary | ICD-10-CM | POA: Diagnosis not present

## 2024-01-25 DIAGNOSIS — J4521 Mild intermittent asthma with (acute) exacerbation: Secondary | ICD-10-CM | POA: Diagnosis not present

## 2024-01-25 DIAGNOSIS — I10 Essential (primary) hypertension: Secondary | ICD-10-CM | POA: Diagnosis not present

## 2024-08-22 ENCOUNTER — Other Ambulatory Visit: Payer: Self-pay | Admitting: Gastroenterology

## 2024-09-12 ENCOUNTER — Encounter (HOSPITAL_COMMUNITY): Payer: Self-pay | Admitting: Gastroenterology

## 2024-09-12 NOTE — Progress Notes (Signed)
 Attempted to obtain medical history for pre op call via telephone, unable to reach at this time. HIPAA compliant voicemail message left requesting return call to pre surgical testing department.

## 2024-09-27 ENCOUNTER — Encounter (HOSPITAL_COMMUNITY): Payer: Self-pay | Admitting: Gastroenterology

## 2024-09-27 ENCOUNTER — Ambulatory Visit (HOSPITAL_COMMUNITY): Admitting: Anesthesiology

## 2024-09-27 ENCOUNTER — Encounter (HOSPITAL_COMMUNITY): Admitting: Anesthesiology

## 2024-09-27 ENCOUNTER — Ambulatory Visit (HOSPITAL_COMMUNITY)
Admission: RE | Admit: 2024-09-27 | Discharge: 2024-09-27 | Disposition: A | Discharge status: Home / Self Care | Attending: Gastroenterology | Admitting: Gastroenterology

## 2024-09-27 ENCOUNTER — Encounter (HOSPITAL_COMMUNITY)
Admission: RE | Disposition: A | Discharge status: Home / Self Care | Payer: Self-pay | Source: Home / Self Care | Attending: Gastroenterology

## 2024-09-27 ENCOUNTER — Other Ambulatory Visit: Payer: Self-pay

## 2024-09-27 DIAGNOSIS — I10 Essential (primary) hypertension: Secondary | ICD-10-CM

## 2024-09-27 DIAGNOSIS — K219 Gastro-esophageal reflux disease without esophagitis: Secondary | ICD-10-CM | POA: Insufficient documentation

## 2024-09-27 DIAGNOSIS — K5730 Diverticulosis of large intestine without perforation or abscess without bleeding: Secondary | ICD-10-CM

## 2024-09-27 DIAGNOSIS — K449 Diaphragmatic hernia without obstruction or gangrene: Secondary | ICD-10-CM | POA: Insufficient documentation

## 2024-09-27 DIAGNOSIS — Z860101 Personal history of adenomatous and serrated colon polyps: Secondary | ICD-10-CM | POA: Insufficient documentation

## 2024-09-27 DIAGNOSIS — F419 Anxiety disorder, unspecified: Secondary | ICD-10-CM | POA: Diagnosis not present

## 2024-09-27 DIAGNOSIS — Z1211 Encounter for screening for malignant neoplasm of colon: Secondary | ICD-10-CM | POA: Diagnosis not present

## 2024-09-27 DIAGNOSIS — K644 Residual hemorrhoidal skin tags: Secondary | ICD-10-CM | POA: Insufficient documentation

## 2024-09-27 DIAGNOSIS — K648 Other hemorrhoids: Secondary | ICD-10-CM | POA: Diagnosis not present

## 2024-09-27 HISTORY — PX: ESOPHAGOGASTRODUODENOSCOPY: SHX5428

## 2024-09-27 HISTORY — PX: COLONOSCOPY: SHX5424

## 2024-09-27 MED ORDER — SODIUM CHLORIDE 0.9 % IV SOLN: INTRAVENOUS | Status: DC

## 2024-09-27 MED ORDER — GLYCOPYRROLATE PF 0.2 MG/ML IJ SOSY
PREFILLED_SYRINGE | INTRAMUSCULAR | Status: DC | PRN
  Administered 2024-09-27: .1 mg via INTRAVENOUS

## 2024-09-27 MED ORDER — PROPOFOL 1000 MG/100ML IV EMUL
INTRAVENOUS | Status: AC
  Filled 2024-09-27: qty 100

## 2024-09-27 MED ORDER — SODIUM CHLORIDE 0.9 % IV SOLN
INTRAVENOUS | Status: AC | PRN
  Administered 2024-09-27: 500 mL via INTRAMUSCULAR

## 2024-09-27 MED ORDER — LIDOCAINE 2% (20 MG/ML) 5 ML SYRINGE
INTRAMUSCULAR | Status: DC | PRN
  Administered 2024-09-27: 50 mg via INTRAVENOUS

## 2024-09-27 MED ORDER — PROPOFOL 500 MG/50ML IV EMUL
INTRAVENOUS | Status: DC | PRN
  Administered 2024-09-27 (×2): 70 mg via INTRAVENOUS
  Administered 2024-09-27: 100 mg via INTRAVENOUS
  Administered 2024-09-27: 125 ug/kg/min via INTRAVENOUS

## 2024-09-27 NOTE — Anesthesia Postprocedure Evaluation (Signed)
"   Anesthesia Post Note  Patient: Ryan Webster  Procedure(s) Performed: COLONOSCOPY EGD (ESOPHAGOGASTRODUODENOSCOPY)     Patient location during evaluation: PACU Anesthesia Type: MAC Level of consciousness: awake and alert Pain management: pain level controlled Vital Signs Assessment: post-procedure vital signs reviewed and stable Respiratory status: spontaneous breathing, nonlabored ventilation, respiratory function stable and patient connected to nasal cannula oxygen Cardiovascular status: stable and blood pressure returned to baseline Postop Assessment: no apparent nausea or vomiting Anesthetic complications: no   There were no known notable events for this encounter.  Last Vitals:  Vitals:   09/27/24 0830 09/27/24 0840  BP: 133/81 (!) 145/86  Pulse: 89 65  Resp: 17 20  Temp:    SpO2: 95% 93%    Last Pain:  Vitals:   09/27/24 0840  TempSrc:   PainSc: 0-No pain                 Lynwood MARLA Cornea      "

## 2024-09-27 NOTE — Transfer of Care (Signed)
 Immediate Anesthesia Transfer of Care Note  Patient: Ryan Webster  Procedure(s) Performed: COLONOSCOPY EGD (ESOPHAGOGASTRODUODENOSCOPY)  Patient Location: Endoscopy Unit  Anesthesia Type:MAC  Level of Consciousness: awake, alert , oriented, and patient cooperative  Airway & Oxygen Therapy: Patient Spontanous Breathing and Patient connected to face mask oxygen  Post-op Assessment: Report given to RN and Post -op Vital signs reviewed and stable  Post vital signs: Reviewed and stable  Last Vitals:  Vitals Value Taken Time  BP 133/81 09/27/24 08:30  Temp 36.6 C 09/27/24 08:26  Pulse 90 09/27/24 08:32  Resp 24 09/27/24 08:32  SpO2 97 % 09/27/24 08:32  Vitals shown include unfiled device data.  Last Pain:  Vitals:   09/27/24 0830  TempSrc:   PainSc: 0-No pain         Complications: There were no known notable events for this encounter.

## 2024-09-27 NOTE — Discharge Instructions (Signed)
 YOU HAD AN ENDOSCOPIC PROCEDURE TODAY: Refer to the procedure report and other information in the discharge instructions given to you for any specific questions about what was found during the examination. If this information does not answer your questions, please call the [facility] office at [phone] to clarify.   YOU SHOULD EXPECT: Some feelings of bloating in the abdomen. Passage of more gas than usual. Walking can help get rid of the air that was put into your GI tract during the procedure and reduce the bloating. If you had a lower endoscopy (such as a colonoscopy or flexible sigmoidoscopy) you may notice spotting of blood in your stool or on the toilet paper. Some abdominal soreness may be present for a day or two, also.  DIET: Your first meal following the procedure should be a light meal and then it is ok to progress to your normal diet. A half-sandwich or bowl of soup is an example of a good first meal. Heavy or fried foods are harder to digest and may make you feel nauseous or bloated. Drink plenty of fluids but you should avoid alcoholic beverages for 24 hours.  ACTIVITY: Your care partner should take you home directly after the procedure. You should plan to take it easy, moving slowly for the rest of the day. You can resume normal activity the day after the procedure however YOU SHOULD NOT DRIVE, use power tools, machinery or perform tasks that involve climbing or major physical exertion for 24 hours (because of the sedation medicines used during the test).   SYMPTOMS TO REPORT IMMEDIATELY: A gastroenterologist can be reached at any hour. Please call [phone]  for any of the following symptoms:  Following lower endoscopy (colonoscopy, flexible sigmoidoscopy) Excessive amounts of blood in the stool  Significant tenderness, worsening of abdominal pains  Swelling of the abdomen that is new, acute  Fever of 100 or higher  Following upper endoscopy (EGD, EUS, ERCP, esophageal  dilation) Vomiting of blood or coffee ground material  New, significant abdominal pain  New, significant chest pain or pain under the shoulder blades  Painful or persistently difficult swallowing  New shortness of breath  Black, tarry-looking or red, bloody stools  FOLLOW UP:  If any biopsies were taken you will be contacted by phone or by letter within the next 1-3 weeks. Call [phone]  if you have not heard about the biopsies in 3 weeks.  Please also call with any specific questions about appointments or follow up tests.

## 2024-09-27 NOTE — Op Note (Signed)
 [facility] Patient Name: Cheveyo Virginia Procedure Date: 09/27/2024 MRN: [id number] Attending MD: Oliva Boots , MD, [id number] Date of Birth: Dec 09, 1955 CSN: [id number] Age: 69 Admit Type: Outpatient Procedure:                Colonoscopy Indications:              Last colonoscopy: December 2019, Follow-up for                            history of adenomatous polyps in the colon Providers:                Oliva Boots, MD, Willy Hummer, RN, Curtistine Bishop, Technician Referring MD:              Medicines:                Monitored Anesthesia Care Complications:            No immediate complications. Estimated Blood Loss:     Estimated blood loss: none. Procedure:                Pre-Anesthesia Assessment:                           - Prior to the procedure, a History and Physical                            was performed, and patient medications and                            allergies were reviewed. The patient's tolerance of                            previous anesthesia was also reviewed. The risks                            and benefits of the procedure and the sedation                            options and risks were discussed with the patient.                            All questions were answered, and informed consent                            was obtained. Prior Anticoagulants: The patient has                            taken no anticoagulant or antiplatelet agents. ASA                            Grade Assessment: II - A patient with mild systemic  disease. After reviewing the risks and benefits,                            the patient was deemed in satisfactory condition to                            undergo the procedure.                           After obtaining informed consent, the colonoscope                            was passed under direct vision. Throughout the                            procedure, the  patient's blood pressure, pulse, and                            oxygen saturations were monitored continuously. The                            [id number]-[id number] ([id number]) Olympus colonoscope was                            introduced through the anus and advanced to the the                            terminal ileum. The colonoscopy was performed                            without difficulty. The patient tolerated the                            procedure well. The quality of the bowel                            preparation was adequate to identify polyps greater                            than 5 mm in size. The terminal ileum, ileocecal                            valve, appendiceal orifice, and rectum were                            photographed. Scope In: 8:01:06 AM Scope Out: 8:20:38 AM Scope Withdrawal Time: 0 hours 15 minutes 26 seconds  Total Procedure Duration: 0 hours 19 minutes 32 seconds  Findings:      External and internal hemorrhoids were found during retroflexion, during       perianal exam and during digital exam. The hemorrhoids were small.      Scattered small-mouthed diverticula were found in the sigmoid colon.      The terminal ileum appeared normal.      The exam was otherwise without abnormality on  direct and retroflexion       views. Impression:               - External and internal hemorrhoids.                           - Diverticulosis in the sigmoid colon.                           - The examined portion of the ileum was normal.                           - The examination was otherwise normal on direct                            and retroflexion views.                           - No specimens collected. Moderate Sedation:      Not Applicable - Patient had care per Anesthesia. Recommendation:           - Patient has a contact number available for                            emergencies. The signs and symptoms of potential                            delayed complications  were discussed with the                            patient. Return to normal activities tomorrow.                            Written discharge instructions were provided to the                            patient.                           - Soft diet today.                           - Continue present medications.                           - Repeat colonoscopy in 5 years for screening                            purposes. Okay to do at our office                           - Return to GI office PRN.                           - Telephone GI clinic if symptomatic PRN. Procedure Code(s):        --- Professional ---  54621, Colonoscopy, flexible; diagnostic, including                            collection of specimen(s) by brushing or washing,                            when performed (separate procedure) Diagnosis Code(s):        --- Professional ---                           Z86.010, Personal history of colonic polyps                           K57.30, Diverticulosis of large intestine without                            perforation or abscess without bleeding CPT copyright 2022 American Medical Association. All rights reserved. The codes documented in this report are preliminary and upon coder review may  be revised to meet current compliance requirements. Oliva Boots, MD 09/27/2024 8:35:33 AM This report has been signed electronically. Number of Addenda: 0

## 2024-09-27 NOTE — Op Note (Signed)
 [facility] Patient Name: Ryan Webster Procedure Date: 09/27/2024 MRN: [id number] Attending MD: Oliva Boots , MD, [id number] Date of Birth: 1955/12/23 CSN: [id number] Age: 69 Admit Type: Outpatient Procedure:                Upper GI endoscopy Indications:              Suspected esophageal reflux Providers:                Oliva Boots, MD, Curtistine Bishop, Technician, Willy Hummer, RN Referring MD:              Medicines:                Monitored Anesthesia Care Complications:            No immediate complications. Estimated Blood Loss:     Estimated blood loss: none. Procedure:                Pre-Anesthesia Assessment:                           - Prior to the procedure, a History and Physical                            was performed, and patient medications and                            allergies were reviewed. The patient's tolerance of                            previous anesthesia was also reviewed. The risks                            and benefits of the procedure and the sedation                            options and risks were discussed with the patient.                            All questions were answered, and informed consent                            was obtained. Prior Anticoagulants: The patient has                            taken no anticoagulant or antiplatelet agents. ASA                            Grade Assessment: II - A patient with mild systemic                            disease. After reviewing the risks and benefits,  the patient was deemed in satisfactory condition to                            undergo the procedure.                           After obtaining informed consent, the endoscope was                            passed under direct vision. Throughout the                            procedure, the patient's blood pressure, pulse, and                            oxygen saturations  were monitored continuously. The                            [id number]-H190 ([id number] Olympus endoscope was introduced                            through the mouth, and advanced to the third part                            of duodenum. The upper GI endoscopy was                            accomplished without difficulty. The patient                            tolerated the procedure well. Scope In: Scope Out: Findings:      A small hiatal hernia was present.      The entire examined stomach was normal.      The duodenal bulb, first portion of the duodenum, second portion of the       duodenum and third portion of the duodenum were normal.      The cardia and gastric fundus were normal on retroflexion. Impression:               - Small hiatal hernia.                           - Normal stomach.                           - Normal duodenal bulb, first portion of the                            duodenum, second portion of the duodenum and third                            portion of the duodenum.                           - No specimens collected. Moderate Sedation:      Not Applicable - Patient  had care per Anesthesia. Recommendation:           - Patient has a contact number available for                            emergencies. The signs and symptoms of potential                            delayed complications were discussed with the                            patient. Return to normal activities tomorrow.                            Written discharge instructions were provided to the                            patient.                           - Soft diet today.                           - Continue present medications.                           - Return to GI clinic PRN.                           - Telephone GI clinic if symptomatic PRN.                           - Perform a colonoscopy today. Procedure Code(s):        --- Professional ---                           [phone],  Esophagogastroduodenoscopy, flexible,                            transoral; diagnostic, including collection of                            specimen(s) by brushing or washing, when performed                            (separate procedure) Diagnosis Code(s):        --- Professional ---                           K44.9, Diaphragmatic hernia without obstruction or                            gangrene CPT copyright 2022 [employer]. All rights reserved. The codes documented in this report are preliminary and upon coder review may  be revised to meet current compliance requirements. Oliva Boots, MD 09/27/2024 8:39:25 AM This report has been signed electronically. Number of Addenda: 0

## 2024-09-27 NOTE — Anesthesia Procedure Notes (Signed)
 Procedure Name: MAC Date/Time: 09/27/2024 7:50 AM  Performed by: Erick Fitz, CRNAPre-anesthesia Checklist: Patient identified, Emergency Drugs available, Suction available, Patient being monitored and Timeout performed Patient Re-evaluated:Patient Re-evaluated prior to induction Oxygen Delivery Method: Simple face mask Preoxygenation: Pre-oxygenation with 100% oxygen Induction Type: IV induction Placement Confirmation: positive ETCO2 and CO2 detector Dental Injury: Teeth and Oropharynx as per pre-operative assessment

## 2024-09-27 NOTE — Progress Notes (Signed)
 Ryan Webster Molt 7:41 AM  Subjective: Patient doing well without much GI symptoms and no new medical problems since he was seen recently in the office and we rediscussed the procedures he has no other complaints  Objective: Vital signs stable afebrile no acute distress exam please see preassessment evaluation  Assessment: Upper tract symptoms and history of colon polyps overdue for colon screening  Plan: Will proceed with colonoscopy and endoscopy with anesthesia assistance  [facility] [place] [facility]  office [phone] After 5PM or if no answer call [phone]

## 2024-09-27 NOTE — Anesthesia Preprocedure Evaluation (Signed)
"                                    Anesthesia Evaluation  Patient identified by MRN, date of birth, ID band Patient awake    Reviewed: Allergy & Precautions, NPO status , Patient's Chart, lab work & pertinent test results, reviewed documented beta blocker date and time   History of Anesthesia Complications Negative for: history of anesthetic complications  Airway Mallampati: III  TM Distance: >3 FB     Dental no notable dental hx.    Pulmonary neg COPD   breath sounds clear to auscultation       Cardiovascular hypertension, (-) CAD, (-) Past MI, (-) Cardiac Stents and (-) CABG  Rhythm:Regular Rate:Normal     Neuro/Psych neg Seizures PSYCHIATRIC DISORDERS Anxiety      Neuromuscular disease    GI/Hepatic ,GERD  ,,(+) neg Cirrhosis        Endo/Other    Renal/GU Renal disease     Musculoskeletal  (+) Arthritis , Osteoarthritis,    Abdominal   Peds  Hematology   Anesthesia Other Findings   Reproductive/Obstetrics                              Anesthesia Physical Anesthesia Plan  ASA: 2  Anesthesia Plan: MAC   Post-op Pain Management:    Induction: Intravenous  PONV Risk Score and Plan: 1 and Ondansetron and Propofol  infusion  Airway Management Planned: Natural Airway and Nasal Cannula  Additional Equipment:   Intra-op Plan:   Post-operative Plan:   Informed Consent: I have reviewed the patients History and Physical, chart, labs and discussed the procedure including the risks, benefits and alternatives for the proposed anesthesia with the patient or authorized representative who has indicated his/her understanding and acceptance.     Dental advisory given  Plan Discussed with: CRNA  Anesthesia Plan Comments:          Anesthesia Quick Evaluation  "

## 2024-09-29 ENCOUNTER — Encounter (HOSPITAL_COMMUNITY): Payer: Self-pay | Admitting: Gastroenterology
